# Patient Record
Sex: Male | Born: 1995 | Race: Black or African American | Hispanic: No | Marital: Single | Smoking: Never smoker
Health system: Southern US, Community
[De-identification: ages and names within clinical notes are randomized; demographics above are authoritative.]

## PROBLEM LIST (undated history)

## (undated) DIAGNOSIS — R569 Unspecified convulsions: Secondary | ICD-10-CM

## (undated) DIAGNOSIS — G40909 Epilepsy, unspecified, not intractable, without status epilepticus: Secondary | ICD-10-CM

---
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
## 2015-01-03 ENCOUNTER — Telehealth: Payer: Self-pay | Admitting: Family

## 2015-01-03 DIAGNOSIS — G40109 Localization-related (focal) (partial) symptomatic epilepsy and epileptic syndromes with simple partial seizures, not intractable, without status epilepticus: Secondary | ICD-10-CM

## 2015-01-03 DIAGNOSIS — G40309 Generalized idiopathic epilepsy and epileptic syndromes, not intractable, without status epilepticus: Secondary | ICD-10-CM
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
## 2015-01-06 MED ORDER — LEVETIRACETAM 500 MG PO TABS: ORAL_TABLET | ORAL | Status: AC

## 2015-01-06 MED ORDER — CARBATROL 200 MG PO CP12: ORAL_CAPSULE | ORAL | Status: AC

## 2015-01-06 MED ORDER — TEGRETOL-XR 400 MG PO TB12: ORAL_TABLET | ORAL | Status: AC

## 2015-01-06 NOTE — Telephone Encounter (Signed)
Anthony Fitzgerald, mom, lvm requesting call back from Dr. Rexene EdisonH regarding the below phone note. She can be reached at [phone].

## 2015-01-06 NOTE — Telephone Encounter (Signed)
[facility]  I gave verbal order for carbamazepine and carbamazepine XR.  Medicaid is not responsible for covering irresponsible behavior.  However if he does not get any any abrupt medication, he will continue to have frequent seizures.  Whether or not the generics work as well as the trade drug remains to be seen but it can't be changed until the new prescription is available.

## 2015-01-06 NOTE — Telephone Encounter (Signed)
[facility] stating that Medicaid will not pay for refills on the Tegretol and Carbartol until January 21, 2015. She said that mother cannot afford the medications bc they are BMN. They are asking that these 2 medications be sent to the pharmacy as generics ? I told the pharmacy that I would need to speak with Dr. Rexene EdisonH and that I would get back to them. Pharmacy number is: [phone].

## 2015-01-06 NOTE — Telephone Encounter (Signed)
He apparently lost his medications and did not tell his mother.  I have sent orders to the pharmacy to have a prescriptions refilled.  I hope they will do so.  Two of these needed to be faxed because they were brand medically necessary.

## 2015-01-16 ENCOUNTER — Telehealth: Payer: Self-pay

## 2015-01-16 DIAGNOSIS — G40109 Localization-related (focal) (partial) symptomatic epilepsy and epileptic syndromes with simple partial seizures, not intractable, without status epilepticus: Secondary | ICD-10-CM

## 2015-01-16 DIAGNOSIS — G40309 Generalized idiopathic epilepsy and epileptic syndromes, not intractable, without status epilepticus: Secondary | ICD-10-CM

## 2015-01-16 MED ORDER — CLONAZEPAM 1 MG PO TBDP: 1.0000 mg | ORAL_TABLET | ORAL | Status: AC | PRN

## 2015-01-16 NOTE — Telephone Encounter (Signed)
Rx sent. TG 

## 2015-01-16 NOTE — Telephone Encounter (Signed)
Received refill request stating that this medication was picked up today and that they need another refill to keep on file for the next time. I called mom and scheduled patient's f/u with Dr. Rexene EdisonH, 02/17/15 @ 10:45 am with arrival time at 10:30 am. I let mom know that she will be receiving a phone call to remind her the day before the appt. She expressed understanding.

## 2015-01-28 ENCOUNTER — Other Ambulatory Visit: Payer: Self-pay | Admitting: Family

## 2015-01-28 MED ORDER — CETIRIZINE HCL 10 MG PO TABS: 10.0000 mg | ORAL_TABLET | Freq: Every day | ORAL | Status: AC

## 2015-02-17 ENCOUNTER — Ambulatory Visit: Payer: Medicaid Other | Admitting: Pediatrics

## 2015-02-27 ENCOUNTER — Emergency Department
Admit: 2015-02-27 | Disposition: A | Discharge status: Home / Self Care | Payer: Self-pay | Admitting: Emergency Medicine

## 2015-02-27 LAB — BASIC METABOLIC PANEL
ANION GAP: 10 (ref 7–16)
BUN: 9 mg/dL
CO2: 23 mmol/L
Calcium, Total: 9 mg/dL
Chloride: 105 mmol/L
Creatinine: 0.87 mg/dL
EGFR (African American): >60
EGFR (Non-African Amer.): >60
GLUCOSE: 95 mg/dL
Potassium: 3.8 mmol/L
SODIUM: 138 mmol/L

## 2015-02-27 LAB — URINALYSIS, COMPLETE
BILIRUBIN, UR: NEGATIVE
Bacteria: NONE SEEN
Blood: NEGATIVE
Glucose,UR: NEGATIVE mg/dL (ref 0–75)
KETONE: NEGATIVE
Leukocyte Esterase: NEGATIVE
NITRITE: NEGATIVE
Ph: 5 (ref 4.5–8.0)
Protein: 30
Specific Gravity: 1.019 (ref 1.003–1.030)
Squamous Epithelial: NONE SEEN

## 2015-02-27 LAB — CARBAMAZEPINE LEVEL, TOTAL: Carbamazepine: 4.6 ug/mL (ref 4.0–12.0)

## 2015-02-27 LAB — CBC
HCT: 42.5 % (ref 40.0–52.0)
HGB: 13.2 g/dL (ref 13.0–18.0)
MCH: 23.2 pg — ABNORMAL LOW (ref 26.0–34.0)
MCHC: 31 g/dL — ABNORMAL LOW (ref 32.0–36.0)
MCV: 75 fL — ABNORMAL LOW (ref 80–100)
PLATELETS: 175 10*3/uL (ref 150–440)
RBC: 5.67 10*6/uL (ref 4.40–5.90)
RDW: 14.8 % — ABNORMAL HIGH (ref 11.5–14.5)
WBC: 5.7 10*3/uL (ref 3.8–10.6)

## 2015-03-16 ENCOUNTER — Emergency Department
Admission: EM | Admit: 2015-03-16 | Discharge: 2015-03-16 | Disposition: A | Discharge status: Home / Self Care | Payer: Medicaid Other

## 2015-05-05 ENCOUNTER — Ambulatory Visit: Payer: Medicaid Other | Admitting: Pediatrics

## 2015-07-25 DIAGNOSIS — Z0279 Encounter for issue of other medical certificate: Secondary | ICD-10-CM

## 2021-07-16 DEATH — deceased
